# Patient Record
Sex: Female | Born: 1935 | Race: White | Hispanic: No | Smoking: Never smoker
Health system: Southern US, Community
[De-identification: ages and names within clinical notes are randomized; demographics above are authoritative.]

## PROBLEM LIST (undated history)

## (undated) DIAGNOSIS — C801 Malignant (primary) neoplasm, unspecified: Secondary | ICD-10-CM

## (undated) DIAGNOSIS — I219 Acute myocardial infarction, unspecified: Secondary | ICD-10-CM

## (undated) HISTORY — PX: BREAST SURGERY: SHX581

---

## 2019-08-20 ENCOUNTER — Emergency Department (INDEPENDENT_AMBULATORY_CARE_PROVIDER_SITE_OTHER): Payer: Medicare Other

## 2019-08-20 ENCOUNTER — Other Ambulatory Visit: Payer: Self-pay

## 2019-08-20 ENCOUNTER — Encounter: Payer: Self-pay | Admitting: Emergency Medicine

## 2019-08-20 ENCOUNTER — Emergency Department (INDEPENDENT_AMBULATORY_CARE_PROVIDER_SITE_OTHER)
Admission: EM | Admit: 2019-08-20 | Discharge: 2019-08-20 | Disposition: A | Discharge status: Home / Self Care | Payer: Medicare Other | Source: Home / Self Care | Attending: Family Medicine | Admitting: Family Medicine

## 2019-08-20 DIAGNOSIS — S92351A Displaced fracture of fifth metatarsal bone, right foot, initial encounter for closed fracture: Secondary | ICD-10-CM | POA: Diagnosis not present

## 2019-08-20 DIAGNOSIS — S99911A Unspecified injury of right ankle, initial encounter: Secondary | ICD-10-CM | POA: Diagnosis not present

## 2019-08-20 DIAGNOSIS — W19XXXA Unspecified fall, initial encounter: Secondary | ICD-10-CM

## 2019-08-20 HISTORY — DX: Malignant (primary) neoplasm, unspecified: C80.1

## 2019-08-20 HISTORY — DX: Acute myocardial infarction, unspecified: I21.9

## 2019-08-20 NOTE — ED Provider Notes (Signed)
[facility]    CSN: [id number] Arrival date & time: 08/20/19  1708      History   Chief Complaint Chief Complaint  Patient presents with  . Fall    HPI Toni Rivera is a 85 y.o. female.   Patient tripped about 3 hours ago, twisting her right foot and ankle.  She complains of pain in her right lateral foot.  The history is provided by the patient.  Foot Injury Location:  Foot Time since incident:  3 hours Injury: yes   Mechanism of injury: fall   Fall:    Fall occurred: at home. Foot location:  R foot Pain details:    Quality:  Aching   Radiates to:  Does not radiate   Severity:  Mild   Onset quality:  Sudden   Duration:  3 hours   Timing:  Constant   Progression:  Unchanged Chronicity:  New Prior injury to area:  No Relieved by:  None tried Worsened by:  Bearing weight Ineffective treatments:  Ice Associated symptoms: decreased ROM, stiffness and swelling   Associated symptoms: no back pain, no muscle weakness, no neck pain, no numbness and no tingling     Past Medical History:  Diagnosis Date  . Cancer ([place])   . Heart attack ([place])     There are no active problems to display for this patient.   Past Surgical History:  Procedure Laterality Date  . BREAST SURGERY      OB History   No obstetric history on file.      Home Medications    Prior to Admission medications   Medication Sig Start Date End Date Taking? Authorizing Provider  Coenzyme Q10 (COQ-10) 100 MG CAPS Take by mouth.   Yes [provider]  furosemide (LASIX) 40 MG tablet Take 40 mg by mouth.   Yes [provider]  metoprolol succinate (TOPROL-XL) 100 MG 24 hr tablet Take 100 mg by mouth daily. Take with or immediately following a meal.   Yes [provider]  nitroGLYCERIN (NITROSTAT) 0.4 MG SL tablet Place 0.4 mg under the tongue every 5 (five) minutes as needed for chest pain.   Yes [provider]  potassium chloride 20 MEQ/100ML IVPB  Inject 20 mEq into the vein once.   Yes [provider]  simvastatin (ZOCOR) 40 MG tablet Take 40 mg by mouth daily.   Yes [provider]  tamoxifen (NOLVADEX) 20 MG tablet Take 20 mg by mouth daily.   Yes [provider]    Family History History reviewed. No pertinent family history.  Social History Social History   Tobacco Use  . Smoking status: Never Smoker  . Smokeless tobacco: Never Used  Substance Use Topics  . Alcohol use: Not Currently  . Drug use: Never     Allergies   Latex, Metronidazole, and Neosporin [bacitracin-polymyxin b]   Review of Systems Review of Systems  Musculoskeletal: Positive for stiffness. Negative for back pain and neck pain.  All other systems reviewed and are negative.    Physical Exam Triage Vital Signs ED Triage Vitals  Enc Vitals Group     BP 08/20/19 1743 (!) 182/84     Pulse Rate 08/20/19 1743 66     Resp --      Temp 08/20/19 1743 98.2 F (36.8 C)     Temp Source 08/20/19 1743 Oral     SpO2 --      Weight 08/20/19 1805 129 lb (58.5 kg)  Height 08/20/19 1805 5\' 2"  (1.575 m)     Head Circumference --      Peak Flow --      Pain Score 08/20/19 1744 9     Pain Loc --      Pain Edu? --      Excl. in [place]? --    No data found.  Updated Vital Signs BP (!) 182/84 (BP Location: Right Arm)   Pulse 66   Temp 98.2 F (36.8 C) (Oral)   Ht 5\' 2"  (1.575 m)   Wt 58.5 kg   BMI 23.59 kg/m   Visual Acuity Right Eye Distance:   Left Eye Distance:   Bilateral Distance:    Right Eye Near:   Left Eye Near:    Bilateral Near:     Physical Exam Vitals signs and nursing note reviewed.  Constitutional:      General: She is not in acute distress. HENT:     Head: Atraumatic.     Right Ear: External ear normal.     Left Ear: External ear normal.  Eyes:     Pupils: Pupils are equal, round, and reactive to light.  Neck:     Musculoskeletal: Normal range of motion.  Cardiovascular:     Rate and  Rhythm: Normal rate.  Pulmonary:     Effort: Pulmonary effort is normal.  Musculoskeletal:     Right ankle: Normal.     Right foot: Normal capillary refill. Tenderness and bony tenderness present. No swelling, crepitus, deformity or laceration.       Feet:     Comments: Right foot has distinct tenderness to palpation over the base of the 5th metatarsal.  Distal neurovascular function is intact.   Skin:    General: Skin is warm and dry.  Neurological:     Mental Status: She is alert.      UC Treatments / Results  Labs (all labs ordered are listed, but only abnormal results are displayed) Labs Reviewed - No data to display  EKG   Radiology Dg Ankle Complete Right  Result Date: 08/20/2019 CLINICAL DATA:  Right ankle sprain EXAM: RIGHT ANKLE - COMPLETE 3+ VIEW COMPARISON:  None. FINDINGS: Lateral soft tissue swelling. There is a fracture through the base of the right 5th metatarsal. No additional fracture seen. Joint spaces maintained. IMPRESSION: Fracture through the base of the right 5th metatarsal. Electronically Signed   By: Rolm Baptise M.D.   On: 08/20/2019 17:59   Dg Foot Complete Right  Result Date: 08/20/2019 CLINICAL DATA:  Fall, lateral pain EXAM: RIGHT FOOT COMPLETE - 3+ VIEW COMPARISON:  Ankle series today FINDINGS: There is a fracture through the base of the right 5th metatarsal. Mild displacement. No subluxation or dislocation. IMPRESSION: Fracture through the base of the right 5th metatarsal. Electronically Signed   By: Rolm Baptise M.D.   On: 08/20/2019 18:00    Procedures Procedures (including critical care time)  Medications Ordered in UC Medications - No data to display  Initial Impression / Assessment and Plan / UC Course  I have reviewed the triage vital signs and the nursing notes.  Pertinent labs & imaging results that were available during my care of the patient were reviewed by me and considered in my medical decision making (see chart for details).     Cam walker applied. Followup with Dr. Aundria Mems or Dr. Lynne Leader ([facility]) in 3 days for fracture management.   Final Clinical Impressions(s) / UC Diagnoses  Final diagnoses:  Closed fracture of base of fifth metatarsal bone of right foot, initial encounter  Right ankle injury, initial encounter     Discharge Instructions     Elevate foot/leg as much as possible.  Minimize weight bearing; wear cam walker.  Apply ice pack for 20 to 30 minutes, 3 to 4 times daily  Continue until pain and swelling decrease.  May take Tylenol as needed for pain. Recommend obtaining walker.   ED Prescriptions    None         Kandra Nicolas, MD 08/21/19 [phone]

## 2019-08-20 NOTE — Discharge Instructions (Addendum)
Elevate foot/leg as much as possible.  Minimize weight bearing; wear cam walker.  Apply ice pack for 20 to 30 minutes, 3 to 4 times daily  Continue until pain and swelling decrease.  May take Tylenol as needed for pain. Recommend obtaining walker.

## 2019-08-20 NOTE — ED Triage Notes (Signed)
Fall today hurting RT foot and ankle

## 2019-08-23 ENCOUNTER — Encounter: Payer: Self-pay | Admitting: Family Medicine

## 2019-08-23 ENCOUNTER — Ambulatory Visit (INDEPENDENT_AMBULATORY_CARE_PROVIDER_SITE_OTHER): Payer: Medicare Other | Admitting: Family Medicine

## 2019-08-23 ENCOUNTER — Other Ambulatory Visit: Payer: Self-pay

## 2019-08-23 ENCOUNTER — Telehealth: Payer: Self-pay

## 2019-08-23 VITALS — BP 165/67 | HR 76 | Temp 98.4°F | Wt 129.0 lb

## 2019-08-23 DIAGNOSIS — S92354A Nondisplaced fracture of fifth metatarsal bone, right foot, initial encounter for closed fracture: Secondary | ICD-10-CM | POA: Diagnosis not present

## 2019-08-23 NOTE — Telephone Encounter (Signed)
Spoke with patient's daughter, and gave appt time with Dr Georgina Snell today at 3.  Arrive by 250.  Daughter verbalized understanding,

## 2019-08-23 NOTE — Patient Instructions (Addendum)
Thank you for coming in today. Continue calcium and vit D.  OK to use tylenol for pain.  Use the boot and use the walker.  Ok to take weight off the foot when walking.  Plan to recheck in about 2 weeks.  Return sooner if needed.    Metatarsal Fracture A metatarsal fracture is a break in one of the five bones that connect the toes to the rest of the foot. This may also be called a forefoot fracture. A metatarsal fracture may be:  A crack in the surface of the bone (stress fracture). This often occurs in athletes.  A break all the way through the bone (complete fracture). The bone that connects to the little toe (fifth metatarsal) is most commonly fractured. Ballet dancers often fracture this bone. What are the causes? A metatarsal fracture may be caused by:  Sudden twisting of the foot.  Falling onto the foot.  Something heavy falling onto the foot.  Overuse or repetitive exercise. What increases the risk? This condition is more likely to develop in people who:  Play contact sports.  Do ballet.  Have a condition that causes the bones to become thin and brittle (osteoporosis).  Have a low calcium level. What are the signs or symptoms? Symptoms of this condition include:  Pain that gets worse when walking or standing.  Pain when pressing on the foot or moving the toes.  Swelling.  Bruising on the top or bottom of the foot. How is this diagnosed? This condition may be diagnosed based on:  Your symptoms.  Any recent foot injuries you have had.  A physical exam.  An X-ray of your foot. If you have a stress fracture, it may not show up on an X-ray, and you may need other imaging tests, such as: ? A bone scan. ? CT scan. ? MRI. How is this treated? Treatment depends on how severe your fracture is and how the pieces of the broken bone line up with each other (alignment). Treatment may involve:  Wearing a cast, splint, or supportive boot on your foot.  Using  crutches, and not putting any weight on your foot.  Having surgery to align broken bones (open reduction and internal fixation, ORIF).  Physical therapy.  Follow-up visits and X-rays to make sure you are healing. Follow these instructions at home: If you have a splint or a supportive boot:  Wear the splint or boot as told by your health care provider. Remove it only as told by your health care provider.  Loosen the splint or boot if your toes tingle, become numb, or turn cold and blue.  Keep the splint or boot clean.  If your splint or boot is not waterproof: ? Do not let it get wet. ? Cover it with a watertight covering when you take a bath or a shower. If you have a cast:  Do not stick anything inside the cast to scratch your skin. Doing that increases your risk for infection.  Check the skin around the cast every day. Tell your health care provider about any concerns.  You may put lotion on dry skin around the edges of the cast. Do not put lotion on the skin underneath the cast.  Keep the cast clean.  If the cast is not waterproof: ? Do not let it get wet. ? Cover it with a watertight covering when you take a bath or a shower. Activity  Do not use your affected leg to support your body  weight until your health care provider says that you can. Use crutches as directed.  Ask your health care provider what activities are safe for you during recovery, and ask what activities you need to avoid.  Do physical therapy exercises as directed. Driving  Do not drive or use heavy machinery while taking pain medicine.  Do not drive while wearing a cast, splint, or boot on a foot that you use for driving. Managing pain, stiffness, and swelling   If directed, put ice on painful areas: ? Put ice in a plastic bag. ? Place a towel between your skin and the bag.  If you have a removable splint or boot, remove it as told by your health care provider.  If you have a cast, place a  towel between your cast and the bag. ? Leave the ice on for 20 minutes, 2-3 times a day.  Move your toes often to avoid stiffness and to lessen swelling.  Raise (elevate) your lower leg above the level of your heart while you are sitting or lying down. General instructions  Do not put pressure on any part of the cast or splint until it is fully hardened. This may take several hours.  Take over-the-counter and prescription medicines only as told by your health care provider.  Do not use any products that contain nicotine or tobacco, such as cigarettes and e-cigarettes. These can delay bone healing. If you need help quitting, ask your health care provider.  Do not take baths, swim, or use a hot tub until your health care provider approves. Ask your health care provider if you may take showers.  Keep all follow-up visits as told by your health care provider. This is important. Contact a health care provider if you have:  Pain that gets worse or does not get better with medicine.  A fever.  A bad smell coming from your cast or splint. Get help right away if you have:  Any of the following in your toes or your foot, even after loosening your splint (if applicable): ? Numbness. ? Tingling. ? Coldness. ? Blue skin.  Redness or swelling that gets worse.  Pain that suddenly becomes severe. Summary  A metatarsal fracture is a break in one of the five bones that connect the toes to the rest of the foot.  Treatment depends on how severe your fracture is and how the pieces of the broken bone line up with each other (alignment). This may include wearing a cast, splint, or supportive boot, or using crutches. Sometimes surgery is needed to align the bones.  Ice and elevate your foot to help lessen the pain and swelling.  Make sure you know what symptoms should cause you to get help right away. This information is not intended to replace advice given to you by your health care provider.  Make sure you discuss any questions you have with your health care provider. Document Released: 08/31/2002 Document Revised: 04/01/2019 Document Reviewed: 01/05/2018 Elsevier Patient Education  2020 [employer].

## 2019-08-23 NOTE — Progress Notes (Signed)
    Subjective:    CC: Right foot injury  HPI: Patient was seen in urgent care on August 28.  She suffered an inversion injury the same day.  X-rays show a fracture through the base of the right fifth metatarsal.  Patient was treated with a cam walker boot and advised to follow-up with me today.  In the interim she notes that she is done reasonably well.  She has been using a leftover walker along with the cam walker boot and has some pain with ambulation but overall is pretty happy with how things are going.  She is tried some Tylenol which helps.  Past medical history, Surgical history, Family history not pertinant except as noted below, Social history, Allergies, and medications have been entered into the medical record, reviewed, and no changes needed.   Review of Systems: No headache, visual changes, nausea, vomiting, diarrhea, constipation, dizziness, abdominal pain, skin rash, fevers, chills, night sweats, weight loss, swollen lymph nodes, body aches, joint swelling, muscle aches, chest pain, shortness of breath, mood changes, visual or auditory hallucinations.   Objective:    Vitals:   08/23/19 1456  BP: (!) 165/67  Pulse: 76  Temp: 98.4 F (36.9 C)   General: Well Developed, well nourished, and in no acute distress.  Neuro/Psych: Alert and oriented x3, extra-ocular muscles intact, able to move all 4 extremities, sensation grossly intact. Skin: Warm and dry, no rashes noted.  Respiratory: Not using accessory muscles, speaking in full sentences, trachea midline.  Cardiovascular: Pulses palpable, no extremity edema. Abdomen: Does not appear distended. MSK: Right foot swollen with some bruising.  Tender palpation proximal fifth metatarsal.  Pulses cap refill and sensation are intact distally.  Lab and Radiology Results No results found for this or any previous visit (from the past 72 hour(s)). Dg Ankle Complete Right  Result Date: 08/20/2019 CLINICAL DATA:  Right ankle  sprain EXAM: RIGHT ANKLE - COMPLETE 3+ VIEW COMPARISON:  None. FINDINGS: Lateral soft tissue swelling. There is a fracture through the base of the right 5th metatarsal. No additional fracture seen. Joint spaces maintained. IMPRESSION: Fracture through the base of the right 5th metatarsal. Electronically Signed   By: Rolm Baptise M.D.   On: 08/20/2019 17:59   Dg Foot Complete Right  Result Date: 08/20/2019 CLINICAL DATA:  Fall, lateral pain EXAM: RIGHT FOOT COMPLETE - 3+ VIEW COMPARISON:  Ankle series today FINDINGS: There is a fracture through the base of the right 5th metatarsal. Mild displacement. No subluxation or dislocation. IMPRESSION: Fracture through the base of the right 5th metatarsal. Electronically Signed   By: Rolm Baptise M.D.   On: 08/20/2019 18:00  I personally (independently) visualized and performed the interpretation of the images attached in this note.   Impression and Recommendations:    Assessment and Plan: 85 y.o. female with right proximal fifth metatarsal fracture.  Avulsion type.  Plan to treat with continued cam walker boot with weightbearing as tolerated.  Recommend continued use of walker.  Tylenol reasonable for pain control.  If all is well recheck in 2 weeks.  Return sooner if needed.  We will repeat x-rays at that time.  PDMP not reviewed this encounter. No orders of the defined types were placed in this encounter.  No orders of the defined types were placed in this encounter.   Discussed warning signs or symptoms. Please see discharge instructions. Patient expresses understanding.

## 2019-09-07 ENCOUNTER — Other Ambulatory Visit: Payer: Self-pay

## 2019-09-07 ENCOUNTER — Ambulatory Visit (INDEPENDENT_AMBULATORY_CARE_PROVIDER_SITE_OTHER): Payer: Medicare Other

## 2019-09-07 ENCOUNTER — Ambulatory Visit (INDEPENDENT_AMBULATORY_CARE_PROVIDER_SITE_OTHER): Payer: Medicare Other | Admitting: Family Medicine

## 2019-09-07 VITALS — BP 147/54 | HR 78 | Temp 98.0°F | Wt 127.0 lb

## 2019-09-07 DIAGNOSIS — S92354A Nondisplaced fracture of fifth metatarsal bone, right foot, initial encounter for closed fracture: Secondary | ICD-10-CM

## 2019-09-07 NOTE — Progress Notes (Signed)
Toni Rivera is a 85 y.o. female who presents to [facility] today for follow-up right fifth metatarsal avulsion fracture.  Patient suffered an inversion injury on August 28 and was seen in urgent care.  She was diagnosed with avulsion fracture and treated with cam walker boot and a walker with limited weightbearing.  She followed up with me on August 31 and was doing reasonably well.  In the interim she notes continued pain.  She notes overall the pain is less than it was at the last visit.  She still has some pain with ambulation even with the boot.  She is using a walker to limit ambulation as much as possible.    ROS:  As above  Exam:  BP (!) 147/54   Pulse 78   Temp 98 F (36.7 C) (Oral)   Wt 127 lb (57.6 kg)   BMI 23.23 kg/m  Wt Readings from Last 5 Encounters:  09/07/19 127 lb (57.6 kg)  08/23/19 129 lb (58.5 kg)  08/20/19 129 lb (58.5 kg)   General: Well Developed, well nourished, and in no acute distress.  Neuro/Psych: Alert and oriented x3, extra-ocular muscles intact, able to move all 4 extremities, sensation grossly intact. Skin: Warm and dry, no rashes noted.  Respiratory: Not using accessory muscles, speaking in full sentences, trachea midline.  Cardiovascular: Pulses palpable, no extremity edema. Abdomen: Does not appear distended. MSK: Right foot no significant swelling.  Tender palpation proximal fifth metatarsal. Pulses cap refill and sensation are intact distally.    Lab and Radiology Results X-ray images right foot obtained today personally independently reviewed. Further widening of fracture distal fragment at proximal fifth metatarsal avulsion type.  Widening more than it was seen originally on August 28.  No callus formation yet. Await formal radiology review    Assessment and Plan: 85 y.o. female with right foot fifth metatarsal fracture.  X-ray today shows that the fracture fragment appears to have widened  a bit.  This could be partially due to periosteal reabsorption as well.  Regardless she is not a great surgical candidate.  Plan to continue cam walker boot with limited weightbearing if possible.  Recheck in 2 weeks.  Discussed the possibility of surgery in the future if needed.  Patient would very much like to avoid surgery if possible.  Recheck 2 weeks.  Get x-ray ahead of time at that visit.   PDMP not reviewed this encounter. Orders Placed This Encounter  Procedures  . DG Foot Complete Right    Standing Status:   Future    Number of Occurrences:   1    Standing Expiration Date:   11/06/2020    Order Specific Question:   Reason for Exam (SYMPTOM  OR DIAGNOSIS REQUIRED)    Answer:   f/u fx 5th mettarsal    Order Specific Question:   Preferred imaging location?    Answer:   [facility]    Order Specific Question:   Radiology Contrast Protocol - do NOT remove file path    Answer:   [email or web address]   No orders of the defined types were placed in this encounter.   Historical information moved to improve visibility of documentation.  Past Medical History:  Diagnosis Date  . Cancer ([place])   . Heart attack [facility])    Past Surgical History:  Procedure Laterality Date  . BREAST SURGERY     Social History   Tobacco Use  . Smoking status: Never Smoker  .  Smokeless tobacco: Never Used  Substance Use Topics  . Alcohol use: Not Currently   family history is not on file.  Medications: Current Outpatient Medications  Medication Sig Dispense Refill  . Coenzyme Q10 (COQ-10) 100 MG CAPS Take by mouth.    Marland Kitchen FLUZONE HIGH-DOSE QUADRIVALENT 0.7 ML SUSY See admin instructions.    . furosemide (LASIX) 40 MG tablet Take 40 mg by mouth.    . metoprolol succinate (TOPROL-XL) 100 MG 24 hr tablet Take 100 mg by mouth daily. Take with or immediately following a meal.    . mupirocin ointment (BACTROBAN) 2 % 1 APPLICATION 3X TIMES A DAY.    .  nitroGLYCERIN (NITROSTAT) 0.4 MG SL tablet Place 0.4 mg under the tongue every 5 (five) minutes as needed for chest pain.    . potassium chloride 20 MEQ/100ML IVPB Inject 20 mEq into the vein once.    . simvastatin (ZOCOR) 40 MG tablet Take 40 mg by mouth daily.    . tamoxifen (NOLVADEX) 20 MG tablet Take 20 mg by mouth daily.     No current facility-administered medications for this visit.    Allergies  Allergen Reactions  . Latex   . Metronidazole   . Neosporin [Bacitracin-Polymyxin B]       Discussed warning signs or symptoms. Please see discharge instructions. Patient expresses understanding.

## 2019-09-07 NOTE — Patient Instructions (Signed)
Thank you for coming in today. Continue the boot and limited weight bearing.  Recheck in 2 weeks.  Return sooner if needed.  Use pain as a good indication of activity.

## 2019-09-21 ENCOUNTER — Encounter: Payer: Self-pay | Admitting: Family Medicine

## 2019-09-21 ENCOUNTER — Other Ambulatory Visit: Payer: Self-pay

## 2019-09-21 ENCOUNTER — Ambulatory Visit (INDEPENDENT_AMBULATORY_CARE_PROVIDER_SITE_OTHER): Payer: Medicare Other | Admitting: Family Medicine

## 2019-09-21 ENCOUNTER — Ambulatory Visit (INDEPENDENT_AMBULATORY_CARE_PROVIDER_SITE_OTHER): Payer: Medicare Other

## 2019-09-21 VITALS — BP 158/68 | HR 75 | Wt 135.0 lb

## 2019-09-21 DIAGNOSIS — S92354A Nondisplaced fracture of fifth metatarsal bone, right foot, initial encounter for closed fracture: Secondary | ICD-10-CM

## 2019-09-21 NOTE — Patient Instructions (Signed)
Thank you for coming in today.  Continue the boot for 2 more week.  Ok to advance weight bearing as guided by pain.  Recheck in about 2 weeks.  Bring both shoes again.

## 2019-09-21 NOTE — Progress Notes (Signed)
y          Toni Rivera is a 85 y.o. female who presents to [facility] today for follow-up right fifth metatarsal avulsion fracture.  Patient suffered avulsion injury on August 28 seen in urgent care.  Treated with cam walker boot and limited weightbearing.  Follow-up with me on September 15 she was having less overall pain with cam walker boot.  However x-ray showed widening of the avulsion fragment gap.  In the interim she has continued to improve clinically.  She notes less pain able to weight-bear fully with the cam walker boot.  Overall she think she is a lot better.    ROS:  As above  Exam:  BP (!) 158/68   Pulse 75   Wt 135 lb (61.2 kg)   BMI 24.69 kg/m  Wt Readings from Last 5 Encounters:  09/21/19 135 lb (61.2 kg)  09/07/19 127 lb (57.6 kg)  08/23/19 129 lb (58.5 kg)  08/20/19 129 lb (58.5 kg)   General: Well Developed, well nourished, and in no acute distress.  Neuro/Psych: Alert and oriented x3, extra-ocular muscles intact, able to move all 4 extremities, sensation grossly intact. Skin: Warm and dry, no rashes noted.  Respiratory: Not using accessory muscles, speaking in full sentences, trachea midline.  Cardiovascular: Pulses palpable, no extremity edema. Abdomen: Does not appear distended. MSK: Right foot and ankle normal-appearing no significant swelling not particularly tender at proximal fifth metatarsal.  Normal motion.  Pulses cap refill and sensation are intact.    Lab and Radiology Results X-ray images right foot obtained today personally independently reviewed. Fracture gap at proximal fifth metatarsal head is slightly widening at the medial component compared to prior x-ray however less wide at lateral component compared to prior x-ray.  Appears to be consistent with periosteal absorption. Await formal radiology review.    Assessment and Plan: 85 y.o. female with proximal right fifth metatarsal avulsion fracture  status post about 1 month now.  Clinically significant improvement.  Patient continues to have poor healing on x-ray.  Plan for continued immobilization with Cam walker boot but advancing activity and weightbearing as tolerated for 2 more weeks.  If clinically doing great at 6 weeks may consider transition to ASO brace.  Recheck 2 weeks.  We will get x-ray prior to visit.   PDMP not reviewed this encounter. Orders Placed This Encounter  Procedures  . DG Foot Complete Right    Standing Status:   Future    Number of Occurrences:   1    Standing Expiration Date:   11/20/2020    Order Specific Question:   Reason for Exam (SYMPTOM  OR DIAGNOSIS REQUIRED)    Answer:   f/u fx    Order Specific Question:   Preferred imaging location?    Answer:   [facility]    Order Specific Question:   Radiology Contrast Protocol - do NOT remove file path    Answer:   \\[email or web address]s.pdf   No orders of the defined types were placed in this encounter.   Historical information moved to improve visibility of documentation.  Past Medical History:  Diagnosis Date  . Cancer ([place])   . Heart attack [facility])    Past Surgical History:  Procedure Laterality Date  . BREAST SURGERY     Social History   Tobacco Use  . Smoking status: Never Smoker  . Smokeless tobacco: Never Used  Substance Use Topics  . Alcohol use: Not Currently  family history is not on file.  Medications: Current Outpatient Medications  Medication Sig Dispense Refill  . Coenzyme Q10 (COQ-10) 100 MG CAPS Take by mouth.    Marland Kitchen FLUZONE HIGH-DOSE QUADRIVALENT 0.7 ML SUSY See admin instructions.    . furosemide (LASIX) 40 MG tablet Take 40 mg by mouth.    . metoprolol succinate (TOPROL-XL) 100 MG 24 hr tablet Take 100 mg by mouth daily. Take with or immediately following a meal.    . mupirocin ointment (BACTROBAN) 2 % 1 APPLICATION 3X TIMES A DAY.    . nitroGLYCERIN (NITROSTAT) 0.4 MG SL tablet  Place 0.4 mg under the tongue every 5 (five) minutes as needed for chest pain.    . potassium chloride 20 MEQ/100ML IVPB Inject 20 mEq into the vein once.    . simvastatin (ZOCOR) 40 MG tablet Take 40 mg by mouth daily.    . tamoxifen (NOLVADEX) 20 MG tablet Take 20 mg by mouth daily.     No current facility-administered medications for this visit.    Allergies  Allergen Reactions  . Latex   . Metronidazole   . Neosporin [Bacitracin-Polymyxin B]       Discussed warning signs or symptoms. Please see discharge instructions. Patient expresses understanding.

## 2019-10-05 ENCOUNTER — Other Ambulatory Visit: Payer: Self-pay

## 2019-10-05 ENCOUNTER — Encounter: Payer: Self-pay | Admitting: Family Medicine

## 2019-10-05 ENCOUNTER — Ambulatory Visit (INDEPENDENT_AMBULATORY_CARE_PROVIDER_SITE_OTHER): Payer: Medicare Other | Admitting: Family Medicine

## 2019-10-05 ENCOUNTER — Ambulatory Visit (INDEPENDENT_AMBULATORY_CARE_PROVIDER_SITE_OTHER): Payer: Medicare Other

## 2019-10-05 VITALS — BP 151/74 | HR 73 | Temp 98.2°F | Wt 135.0 lb

## 2019-10-05 DIAGNOSIS — S92354A Nondisplaced fracture of fifth metatarsal bone, right foot, initial encounter for closed fracture: Secondary | ICD-10-CM

## 2019-10-05 NOTE — Patient Instructions (Signed)
Thank you for coming in today. Use the ankle brace.  Ok to go back to the boot if needed.  Use the walker for balance.  Return sooner if not doing well.  If all is well recheck in 4 weeks for follow up fracture.  We will likely consider physical therapy then.

## 2019-10-05 NOTE — Progress Notes (Signed)
Toni Rivera is a 85 y.o. female who presents to [place] today for follow-up right proximal fifth metatarsal fracture.  Patient suffered inversion injury to her right ankle on August 28.  She was seen in urgent care the same day and had avulsion type fracture of fifth metatarsal.  She was treated with immobilization and nonweightbearing.  Clinically she has done well with significant improvement in pain however she has not had good healing seen on x-ray.  At the last visit on September 29 she was feeling much better and was transition into weightbearing with Cam walker boot with walker.  In the interim she is done well and does not have hardly any pain at all with weightbearing with Cam walker boot with walker.    ROS:  As above  Exam:  BP (!) 151/74   Pulse 73   Temp 98.2 F (36.8 C) (Oral)   Wt 135 lb (61.2 kg)   BMI 24.69 kg/m  Wt Readings from Last 5 Encounters:  10/05/19 135 lb (61.2 kg)  09/21/19 135 lb (61.2 kg)  09/07/19 127 lb (57.6 kg)  08/23/19 129 lb (58.5 kg)  08/20/19 129 lb (58.5 kg)   General: Well Developed, well nourished, and in no acute distress.  Neuro/Psych: Alert and oriented x3, extra-ocular muscles intact, able to move all 4 extremities, sensation grossly intact. Skin: Warm and dry, no rashes noted.  Respiratory: Not using accessory muscles, speaking in full sentences, trachea midline.  Cardiovascular: Pulses palpable, no extremity edema. Abdomen: Does not appear distended. MSK: Right foot and ankle relatively normal-appearing with no significant swelling or deformity. Not particularly tender to palpation at foot at fifth metatarsal or and ankle. Motion somewhat limited but nonpainful. Intact strength to dorsiflexion and eversion. Pulses cap refill and sensation are intact distally.    Lab and Radiology Results X-ray images right foot obtained today personally and independently reviewed. Persistent  fracture line present at proximal fifth metatarsal with no significant callus formation or bony healing compared to prior images.  No change in angulation or displacement. Await formal radiology review    Assessment and Plan: 85 y.o. female with right proximal fifth metatarsal fracture.  Clinically doing quite well but not having good radiographic healing on x-ray.  I think it is very likely that 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  She notes that she finds the cam walker boot very obnoxious and would like to wean out of it if possible.  I think that is pretty reasonable.  Plan to proceed with ASO brace with walker with weightbearing as tolerated.  Recheck in 4 weeks.  At that time if clinically doing well she would like to avoid repeat x-ray if possible.  May consider home health physical therapy at that visit to work on balance and ankle strengthening.  Precautions reviewed.  Of note I will have transition to [facility] in [place] by her follow-up visit in 4 weeks.  She will likely follow-up with my partner here in [place] Dr. Dianah Field.  She is welcome to follow-up with me in [place] if she chooses.   PDMP not reviewed this encounter. Orders Placed This Encounter  Procedures  . DG Foot Complete Right    Standing Status:   Future    Number of Occurrences:   1    Standing Expiration Date:   12/04/2020    Order Specific Question:   Reason for Exam (SYMPTOM  OR DIAGNOSIS  REQUIRED)    Answer:   f/u fx    Order Specific Question:   Preferred imaging location?    Answer:   [facility]    Order Specific Question:   Radiology Contrast Protocol - do NOT remove file path    Answer:   \\[email or web address]   No orders of the defined types were placed in this encounter.   Historical information moved to improve visibility of documentation.  Past Medical History:  Diagnosis Date  . Cancer ([place])   .  Heart attack [facility])    Past Surgical History:  Procedure Laterality Date  . BREAST SURGERY     Social History   Tobacco Use  . Smoking status: Never Smoker  . Smokeless tobacco: Never Used  Substance Use Topics  . Alcohol use: Not Currently   family history is not on file.  Medications: Current Outpatient Medications  Medication Sig Dispense Refill  . Coenzyme Q10 (COQ-10) 100 MG CAPS Take by mouth.    Marland Kitchen FLUZONE HIGH-DOSE QUADRIVALENT 0.7 ML SUSY See admin instructions.    . furosemide (LASIX) 40 MG tablet Take 40 mg by mouth.    . metoprolol succinate (TOPROL-XL) 100 MG 24 hr tablet Take 100 mg by mouth daily. Take with or immediately following a meal.    . mupirocin ointment (BACTROBAN) 2 % 1 APPLICATION 3X TIMES A DAY.    . nitroGLYCERIN (NITROSTAT) 0.4 MG SL tablet Place 0.4 mg under the tongue every 5 (five) minutes as needed for chest pain.    . potassium chloride 20 MEQ/100ML IVPB Inject 20 mEq into the vein once.    . simvastatin (ZOCOR) 40 MG tablet Take 40 mg by mouth daily.    . tamoxifen (NOLVADEX) 20 MG tablet Take 20 mg by mouth daily.     No current facility-administered medications for this visit.    Allergies  Allergen Reactions  . Latex   . Metronidazole   . Neosporin [Bacitracin-Polymyxin B]       Discussed warning signs or symptoms. Please see discharge instructions. Patient expresses understanding.

## 2019-11-03 ENCOUNTER — Encounter: Payer: Self-pay | Admitting: Sports Medicine

## 2019-11-03 ENCOUNTER — Other Ambulatory Visit: Payer: Self-pay

## 2019-11-03 ENCOUNTER — Ambulatory Visit (INDEPENDENT_AMBULATORY_CARE_PROVIDER_SITE_OTHER): Payer: Medicare Other | Admitting: Sports Medicine

## 2019-11-03 DIAGNOSIS — S92354G Nondisplaced fracture of fifth metatarsal bone, right foot, subsequent encounter for fracture with delayed healing: Secondary | ICD-10-CM

## 2019-11-03 NOTE — Progress Notes (Signed)
Subjective:    CC: Follow-up  HPI: Toni Rivera returns, she is a pleasant 85 year old female with a right proximal fifth metatarsal fracture, its been 2-1/2 months and she continues to have a bit of discomfort.  It sounds like she has been progressing to nonunion on previous notes.  Overall she feels okay, only a bit of pain, but not enough to consider an operative intervention.  I reviewed the past medical history, family history, social history, surgical history, and allergies today and no changes were needed.  Please see the problem list section below in epic for further details.  Past Medical History: Past Medical History:  Diagnosis Date  . Cancer ([place])   . Heart attack [facility])    Past Surgical History: Past Surgical History:  Procedure Laterality Date  . BREAST SURGERY     Social History: Social History   Socioeconomic History  . Marital status: Widowed    Spouse name: Not on file  . Number of children: Not on file  . Years of education: Not on file  . Highest education level: Not on file  Occupational History  . Not on file  Social Needs  . Financial resource strain: Not on file  . Food insecurity    Worry: Not on file    Inability: Not on file  . Transportation needs    Medical: Not on file    Non-medical: Not on file  Tobacco Use  . Smoking status: Never Smoker  . Smokeless tobacco: Never Used  Substance and Sexual Activity  . Alcohol use: Not Currently  . Drug use: Never  . Sexual activity: Not on file  Lifestyle  . Physical activity    Days per week: Not on file    Minutes per session: Not on file  . Stress: Not on file  Relationships  . Social Herbalist on phone: Not on file    Gets together: Not on file    Attends religious service: Not on file    Active member of club or organization: Not on file    Attends meetings of clubs or organizations: Not on file    Relationship status: Not on file  Other Topics Concern  . Not on file  Social  History Narrative  . Not on file   Family History: No family history on file. Allergies: Allergies  Allergen Reactions  . Latex   . Metronidazole   . Neosporin [Bacitracin-Polymyxin B]    Medications: See med rec.  Review of Systems: No fevers, chills, night sweats, weight loss, chest pain, or shortness of breath.   Objective:    General: Well Developed, well nourished, and in no acute distress.  Neuro: Alert and oriented x3, extra-ocular muscles intact, sensation grossly intact.  HEENT: Normocephalic, atraumatic, pupils equal round reactive to light, neck supple, no masses, no lymphadenopathy, thyroid nonpalpable.  Skin: Warm and dry, no rashes. Cardiac: Regular rate and rhythm, no murmurs rubs or gallops, no lower extremity edema.  Respiratory: Clear to auscultation bilaterally. Not using accessory muscles, speaking in full sentences. Right foot: No visible erythema or swelling. Range of motion is full in all directions. Strength is 5/5 in all directions. No hallux valgus. No pes cavus or pes planus. No abnormal callus noted. No pain over the navicular prominence, or base of fifth metatarsal. No tenderness to palpation of the calcaneal insertion of plantar fascia. No pain at the Achilles insertion. No pain over the calcaneal bursa. No pain of the retrocalcaneal bursa. No  pain at the base of the fifth metatarsal today. No hallux rigidus or limitus. No tenderness palpation over interphalangeal joints. No pain with compression of the metatarsal heads. Neurovascularly intact distally.  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  Impression and Recommendations:    Closed nondisplaced fracture of fifth right metatarsal bone Nonhealing fifth metatarsal base fracture, 2 and half months out, no discomfort whatsoever at the fracture site. At this point she feels as  though she can live with it, she can discontinue the ASO, continue calcium and vitamin D supplement, wear rigid shoes, return as needed. Fracture appears to be more proximal and not in the [place] territory.   ___________________________________________ Gwen Her. Dianah Field, M.D., ABFM., CAQSM. Primary Care and Sports Medicine [facility]  Adjunct Professor of [employer] [employer]

## 2019-11-03 NOTE — Assessment & Plan Note (Addendum)
Nonhealing fifth metatarsal base fracture, 2 and half months out, no discomfort whatsoever at the fracture site. At this point she feels as though she can live with it, she can discontinue the ASO, continue calcium and vitamin D supplement, wear rigid shoes, return as needed. Fracture appears to be more proximal and not in the [place] territory.

## 2020-09-14 IMAGING — DX RIGHT ANKLE - COMPLETE 3+ VIEW
3 series · 3 of 3 positions shown · non-contrast
Comparison: None.

CLINICAL DATA: Right ankle sprain

EXAM:
RIGHT ANKLE - COMPLETE 3+ VIEW

[ankle ap]
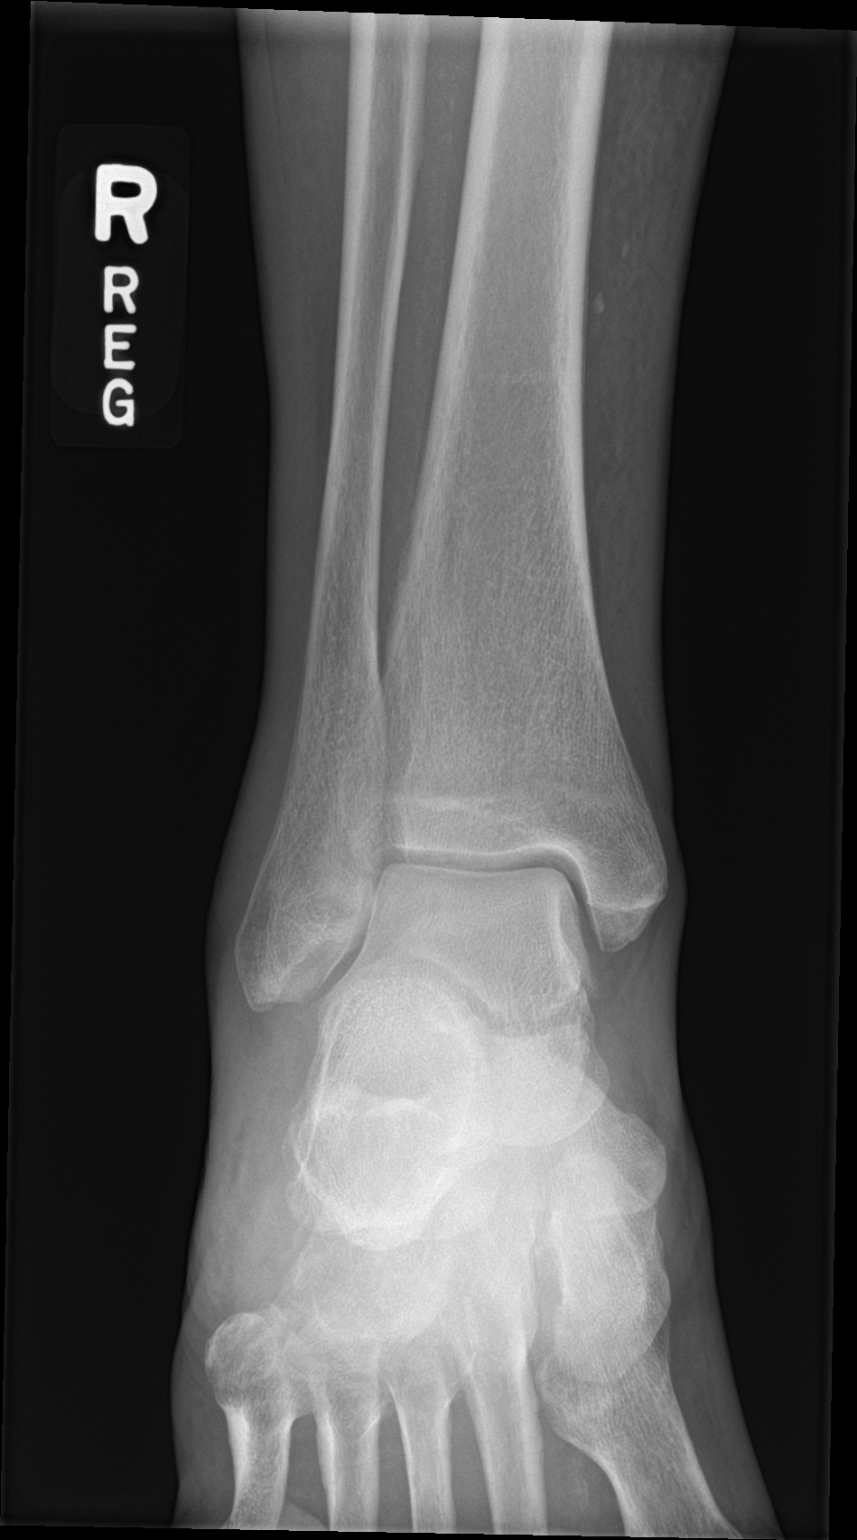

[ankle obl]
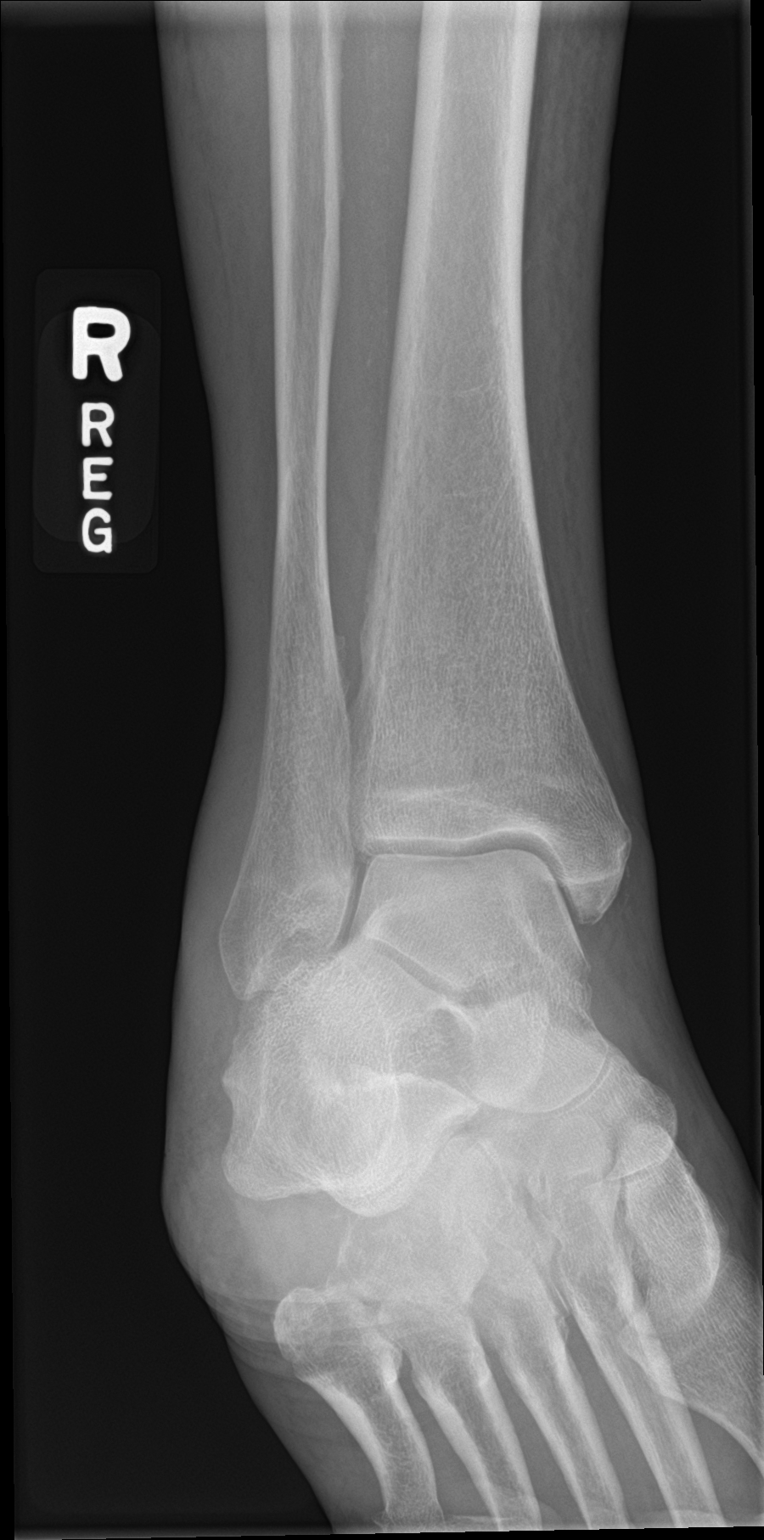

[ankle lat]
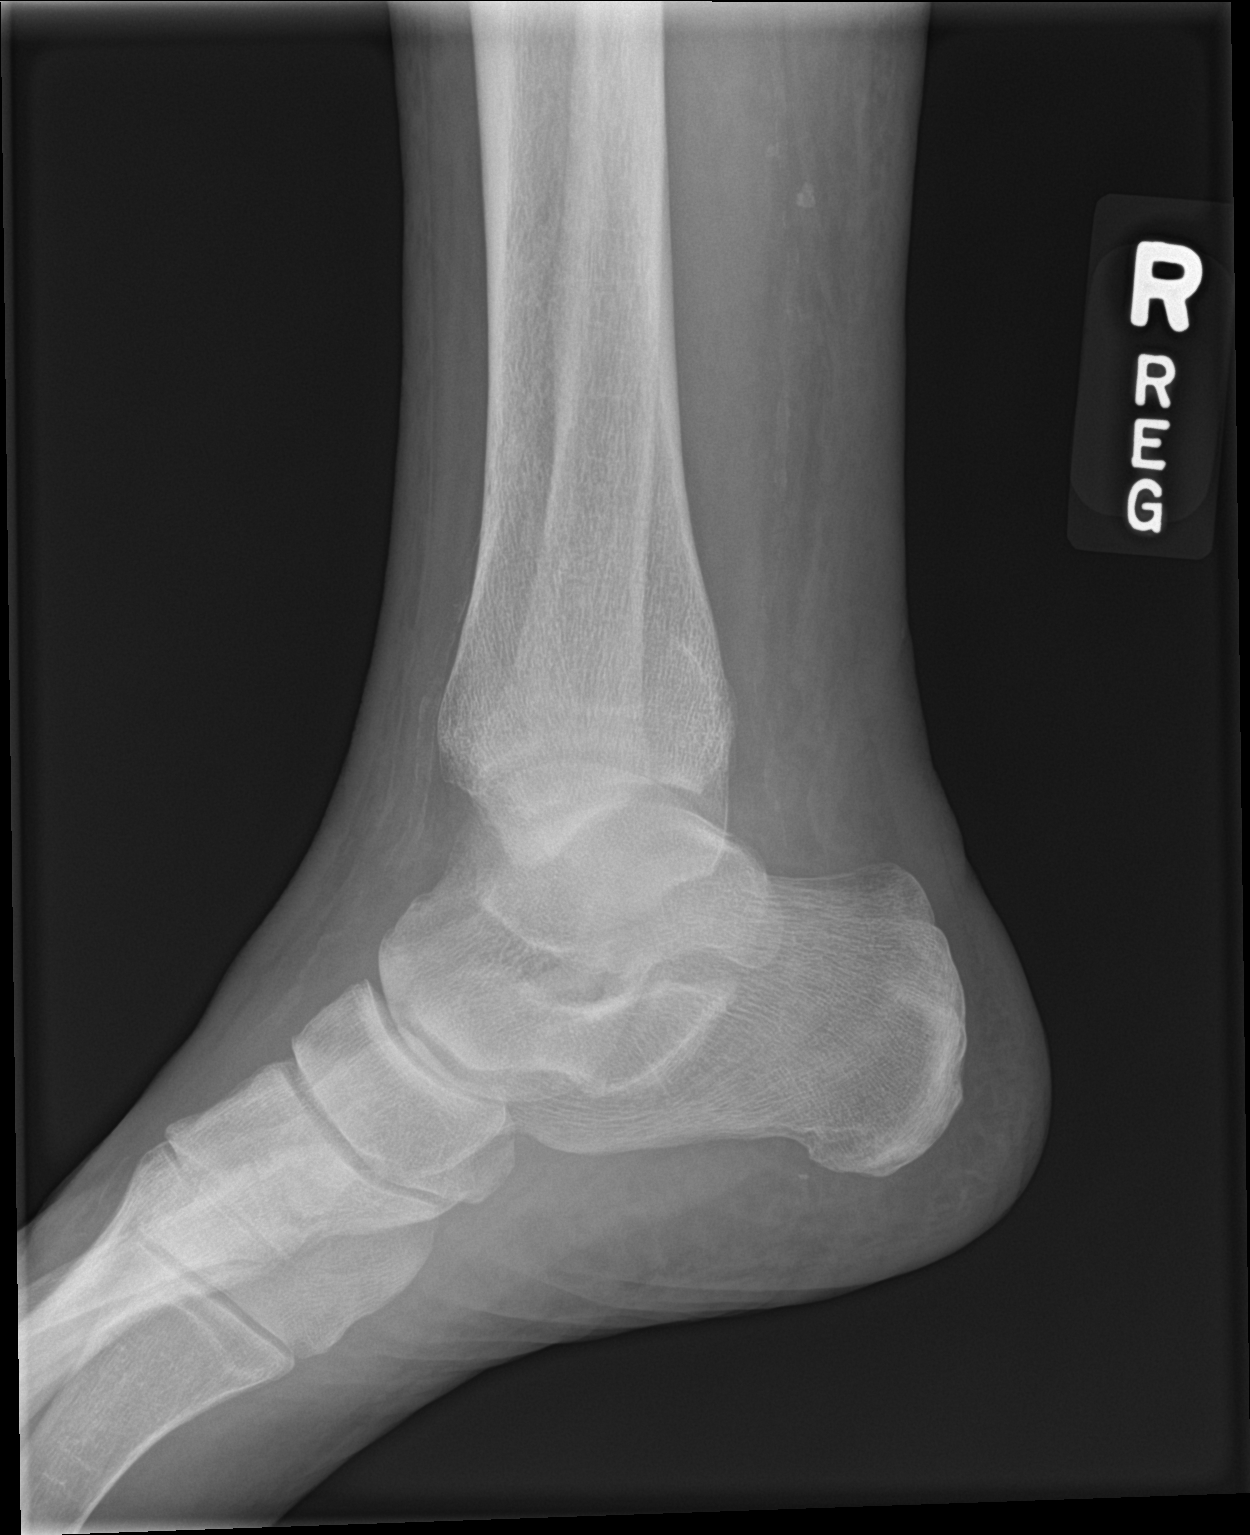

[3 of 3 positions shown; findings below may reference images not displayed]

FINDINGS: Lateral soft tissue swelling. There is a fracture through the base
of the right 5th metatarsal. No additional fracture seen. Joint
spaces maintained.
IMPRESSION: Fracture through the base of the right 5th metatarsal.

## 2020-10-30 IMAGING — DX DG FOOT COMPLETE 3+V*R*
3 series · 3 of 3 positions shown · non-contrast
Comparison: 09/21/2019

CLINICAL DATA: Follow-up of fifth metatarsal fracture.  Tenderness.

EXAM:
RIGHT FOOT COMPLETE - 3+ VIEW

[foot ap]
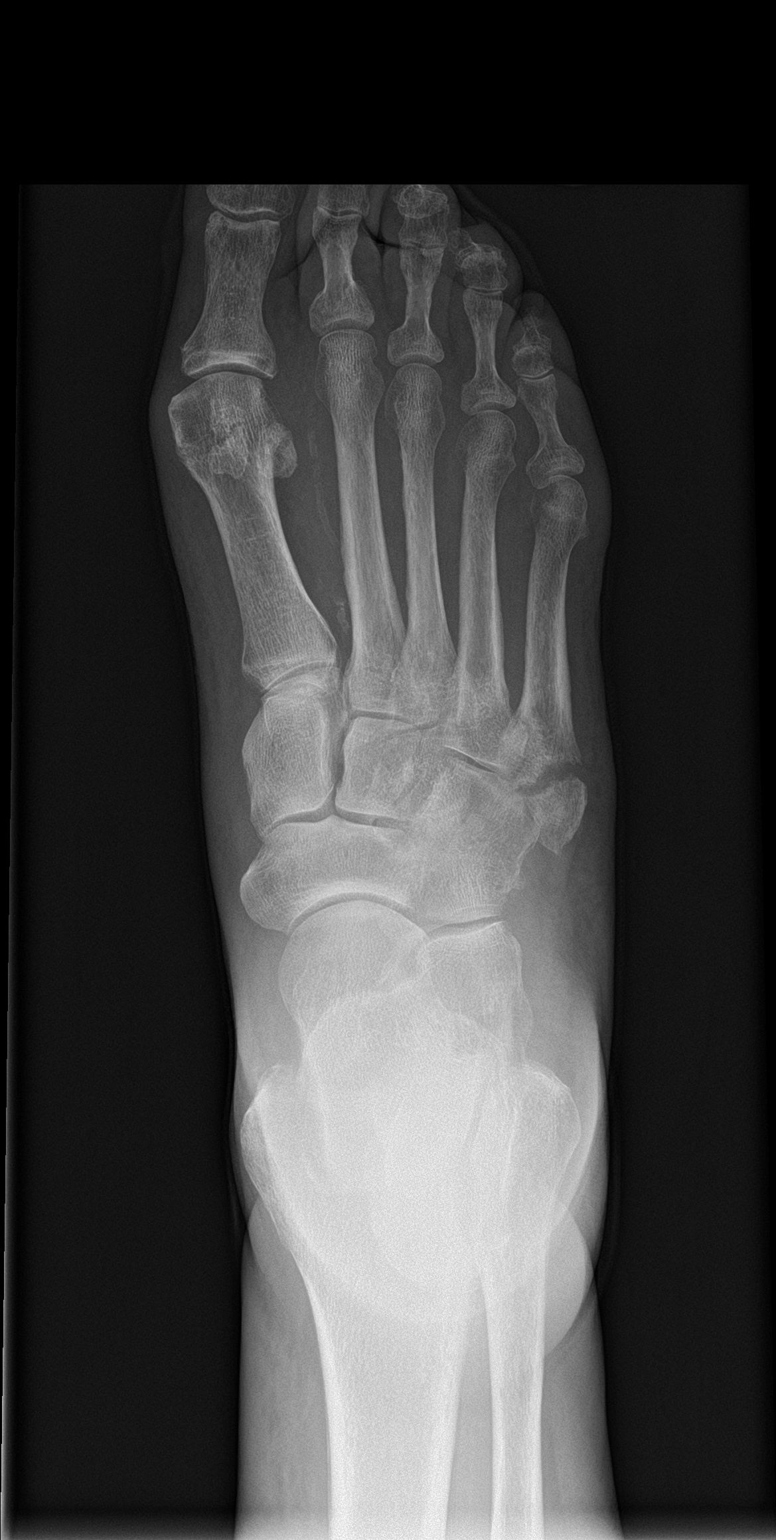

[foot obl]
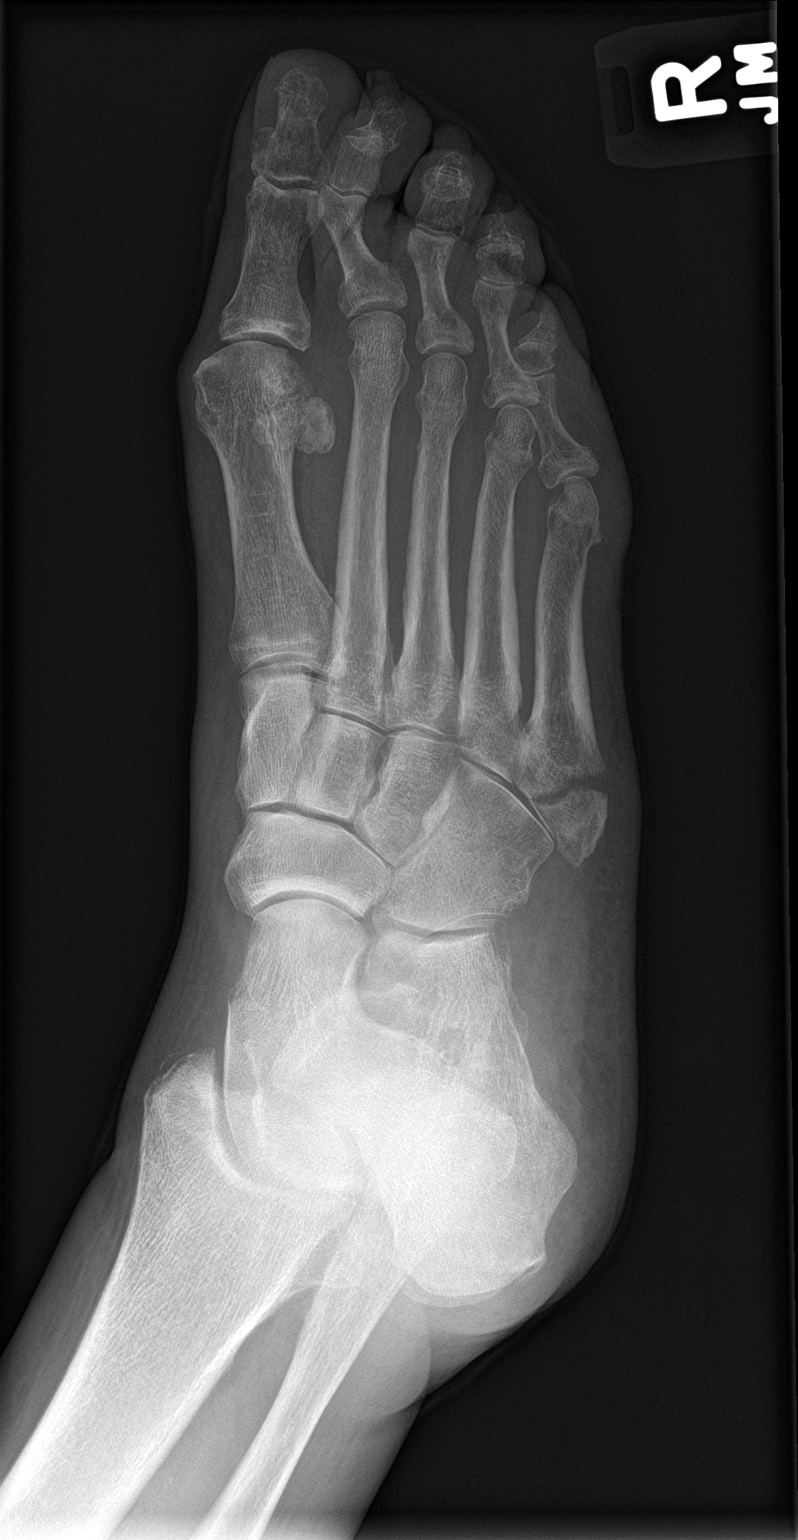

[foot lat]
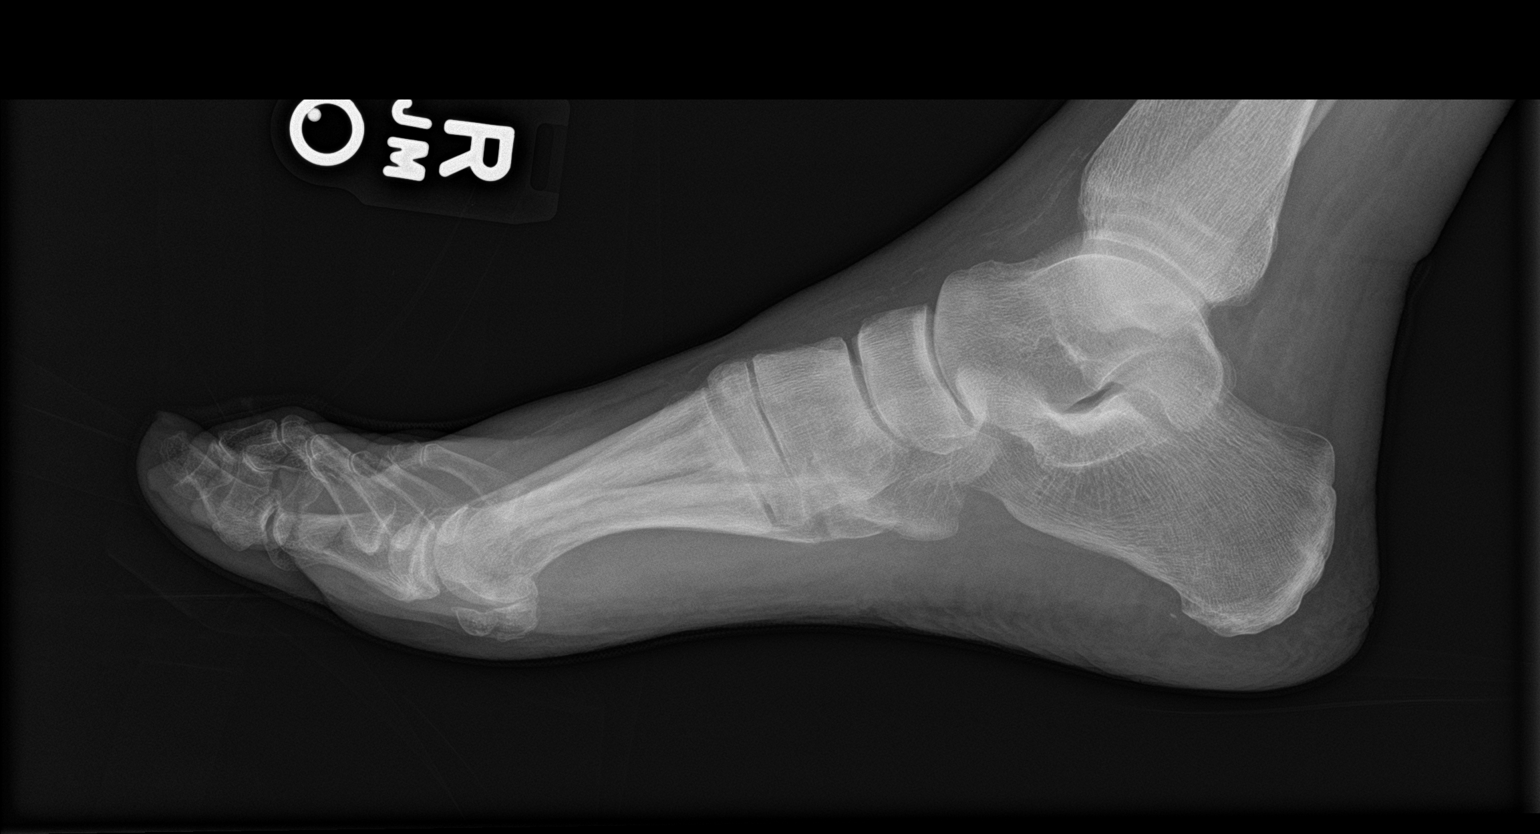

[3 of 3 positions shown; findings below may reference images not displayed]

FINDINGS: Mild hallux valgus deformity. Osteopenia. Oblique image minimally
motion degraded. Hammertoe deformities. Fracture at the base of the
fifth metatarsal may have undergone minimal interval healing, as
evidenced by decrease in width of the fracture. No consist
significant callus deposition.
IMPRESSION: Minimal if any healing across previously described base of fifth
metatarsal fracture.

## 2021-06-22 DEATH — deceased
